# Patient Record
Sex: Male | Born: 1970 | Race: Black or African American | Hispanic: No | Marital: Married | Smoking: Current every day smoker
Health system: Southern US, Community
[De-identification: ages and names within clinical notes are randomized; demographics above are authoritative.]

---

## 2001-03-16 ENCOUNTER — Emergency Department (HOSPITAL_COMMUNITY): Admission: EM | Admit: 2001-03-16 | Discharge: 2001-03-16 | Payer: Self-pay | Admitting: Emergency Medicine

## 2001-04-30 ENCOUNTER — Emergency Department (HOSPITAL_COMMUNITY): Admission: EM | Admit: 2001-04-30 | Discharge: 2001-04-30 | Payer: Self-pay | Admitting: Emergency Medicine

## 2002-09-22 ENCOUNTER — Encounter: Payer: Self-pay | Admitting: *Deleted

## 2002-09-22 ENCOUNTER — Inpatient Hospital Stay (HOSPITAL_COMMUNITY): Admission: EM | Admit: 2002-09-22 | Discharge: 2002-09-23 | Payer: Self-pay | Admitting: *Deleted

## 2003-01-02 ENCOUNTER — Emergency Department (HOSPITAL_COMMUNITY): Admission: EM | Admit: 2003-01-02 | Discharge: 2003-01-02 | Payer: Self-pay | Admitting: Emergency Medicine

## 2003-01-07 ENCOUNTER — Emergency Department (HOSPITAL_COMMUNITY): Admission: EM | Admit: 2003-01-07 | Discharge: 2003-01-07 | Payer: Self-pay | Admitting: Emergency Medicine

## 2003-03-04 ENCOUNTER — Emergency Department (HOSPITAL_COMMUNITY): Admission: EM | Admit: 2003-03-04 | Discharge: 2003-03-04 | Payer: Self-pay

## 2003-09-26 ENCOUNTER — Emergency Department (HOSPITAL_COMMUNITY): Admission: EM | Admit: 2003-09-26 | Discharge: 2003-09-26 | Payer: Self-pay | Admitting: *Deleted

## 2003-10-29 ENCOUNTER — Emergency Department (HOSPITAL_COMMUNITY): Admission: EM | Admit: 2003-10-29 | Discharge: 2003-10-29 | Payer: Self-pay | Admitting: Emergency Medicine

## 2004-01-26 ENCOUNTER — Emergency Department (HOSPITAL_COMMUNITY): Admission: EM | Admit: 2004-01-26 | Discharge: 2004-01-26 | Payer: Self-pay | Admitting: Emergency Medicine

## 2004-03-08 ENCOUNTER — Emergency Department (HOSPITAL_COMMUNITY): Admission: EM | Admit: 2004-03-08 | Discharge: 2004-03-08 | Payer: Self-pay | Admitting: Emergency Medicine

## 2004-03-08 ENCOUNTER — Ambulatory Visit (HOSPITAL_COMMUNITY): Admission: RE | Admit: 2004-03-08 | Discharge: 2004-03-08 | Payer: Self-pay | Admitting: Orthopedic Surgery

## 2004-03-08 ENCOUNTER — Ambulatory Visit (HOSPITAL_BASED_OUTPATIENT_CLINIC_OR_DEPARTMENT_OTHER): Admission: RE | Admit: 2004-03-08 | Discharge: 2004-03-08 | Payer: Self-pay | Admitting: Orthopedic Surgery

## 2004-03-11 ENCOUNTER — Encounter (HOSPITAL_BASED_OUTPATIENT_CLINIC_OR_DEPARTMENT_OTHER): Admission: RE | Admit: 2004-03-11 | Discharge: 2004-05-04 | Payer: Self-pay | Admitting: Orthopedic Surgery

## 2004-05-07 ENCOUNTER — Emergency Department (HOSPITAL_COMMUNITY): Admission: EM | Admit: 2004-05-07 | Discharge: 2004-05-08 | Payer: Self-pay | Admitting: Emergency Medicine

## 2005-10-16 ENCOUNTER — Emergency Department (HOSPITAL_COMMUNITY): Admission: EM | Admit: 2005-10-16 | Discharge: 2005-10-16 | Payer: Self-pay | Admitting: Emergency Medicine

## 2005-11-20 ENCOUNTER — Emergency Department (HOSPITAL_COMMUNITY): Admission: EM | Admit: 2005-11-20 | Discharge: 2005-11-20 | Payer: Self-pay | Admitting: Emergency Medicine

## 2006-06-18 ENCOUNTER — Emergency Department (HOSPITAL_COMMUNITY): Admission: EM | Admit: 2006-06-18 | Discharge: 2006-06-18 | Payer: Self-pay | Admitting: Family Medicine

## 2008-02-10 ENCOUNTER — Emergency Department (HOSPITAL_COMMUNITY): Admission: EM | Admit: 2008-02-10 | Discharge: 2008-02-11 | Payer: Self-pay | Admitting: Emergency Medicine

## 2008-02-21 ENCOUNTER — Ambulatory Visit: Payer: Self-pay | Admitting: Pulmonary Disease

## 2008-02-21 ENCOUNTER — Telehealth (INDEPENDENT_AMBULATORY_CARE_PROVIDER_SITE_OTHER): Payer: Self-pay | Admitting: *Deleted

## 2008-02-21 DIAGNOSIS — R930 Abnormal findings on diagnostic imaging of skull and head, not elsewhere classified: Secondary | ICD-10-CM | POA: Insufficient documentation

## 2008-02-24 ENCOUNTER — Telehealth (INDEPENDENT_AMBULATORY_CARE_PROVIDER_SITE_OTHER): Payer: Self-pay | Admitting: *Deleted

## 2008-02-26 ENCOUNTER — Encounter: Payer: Self-pay | Admitting: Pulmonary Disease

## 2008-02-27 ENCOUNTER — Telehealth: Payer: Self-pay | Admitting: Pulmonary Disease

## 2008-02-28 ENCOUNTER — Ambulatory Visit: Payer: Self-pay | Admitting: Pulmonary Disease

## 2008-02-28 ENCOUNTER — Encounter: Payer: Self-pay | Admitting: Pulmonary Disease

## 2008-02-28 ENCOUNTER — Ambulatory Visit: Admission: RE | Admit: 2008-02-28 | Discharge: 2008-02-28 | Payer: Self-pay | Admitting: Pulmonary Disease

## 2008-03-02 ENCOUNTER — Telehealth: Payer: Self-pay | Admitting: Pulmonary Disease

## 2008-03-13 ENCOUNTER — Encounter: Payer: Self-pay | Admitting: Pulmonary Disease

## 2008-03-16 ENCOUNTER — Telehealth: Payer: Self-pay | Admitting: Pulmonary Disease

## 2008-03-18 ENCOUNTER — Telehealth: Payer: Self-pay | Admitting: Pulmonary Disease

## 2008-05-10 ENCOUNTER — Encounter: Payer: Self-pay | Admitting: Pulmonary Disease

## 2008-05-11 ENCOUNTER — Encounter: Admission: RE | Admit: 2008-05-11 | Discharge: 2008-05-11 | Payer: Self-pay | Admitting: Pulmonary Disease

## 2008-06-01 ENCOUNTER — Ambulatory Visit: Payer: Self-pay | Admitting: Pulmonary Disease

## 2008-06-01 DIAGNOSIS — A150 Tuberculosis of lung: Secondary | ICD-10-CM | POA: Insufficient documentation

## 2008-06-17 ENCOUNTER — Emergency Department (HOSPITAL_COMMUNITY): Admission: EM | Admit: 2008-06-17 | Discharge: 2008-06-17 | Payer: Self-pay | Admitting: Emergency Medicine

## 2008-09-02 ENCOUNTER — Encounter: Payer: Self-pay | Admitting: Pulmonary Disease

## 2008-09-02 ENCOUNTER — Encounter: Admission: RE | Admit: 2008-09-02 | Discharge: 2008-09-02 | Payer: Self-pay | Admitting: Pulmonary Disease

## 2008-09-09 ENCOUNTER — Encounter: Payer: Self-pay | Admitting: Pulmonary Disease

## 2008-11-24 ENCOUNTER — Encounter: Admission: RE | Admit: 2008-11-24 | Discharge: 2008-11-24 | Payer: Self-pay | Admitting: Infectious Diseases

## 2010-03-04 IMAGING — CR DG CHEST 2V
2 series · 2 of 2 positions shown · non-contrast
Comparison: PA and lateral chest [DATE].

CLINICAL DATA: Pneumonia.

CHEST - 2 VIEW

[view not recorded (1 of 2)]
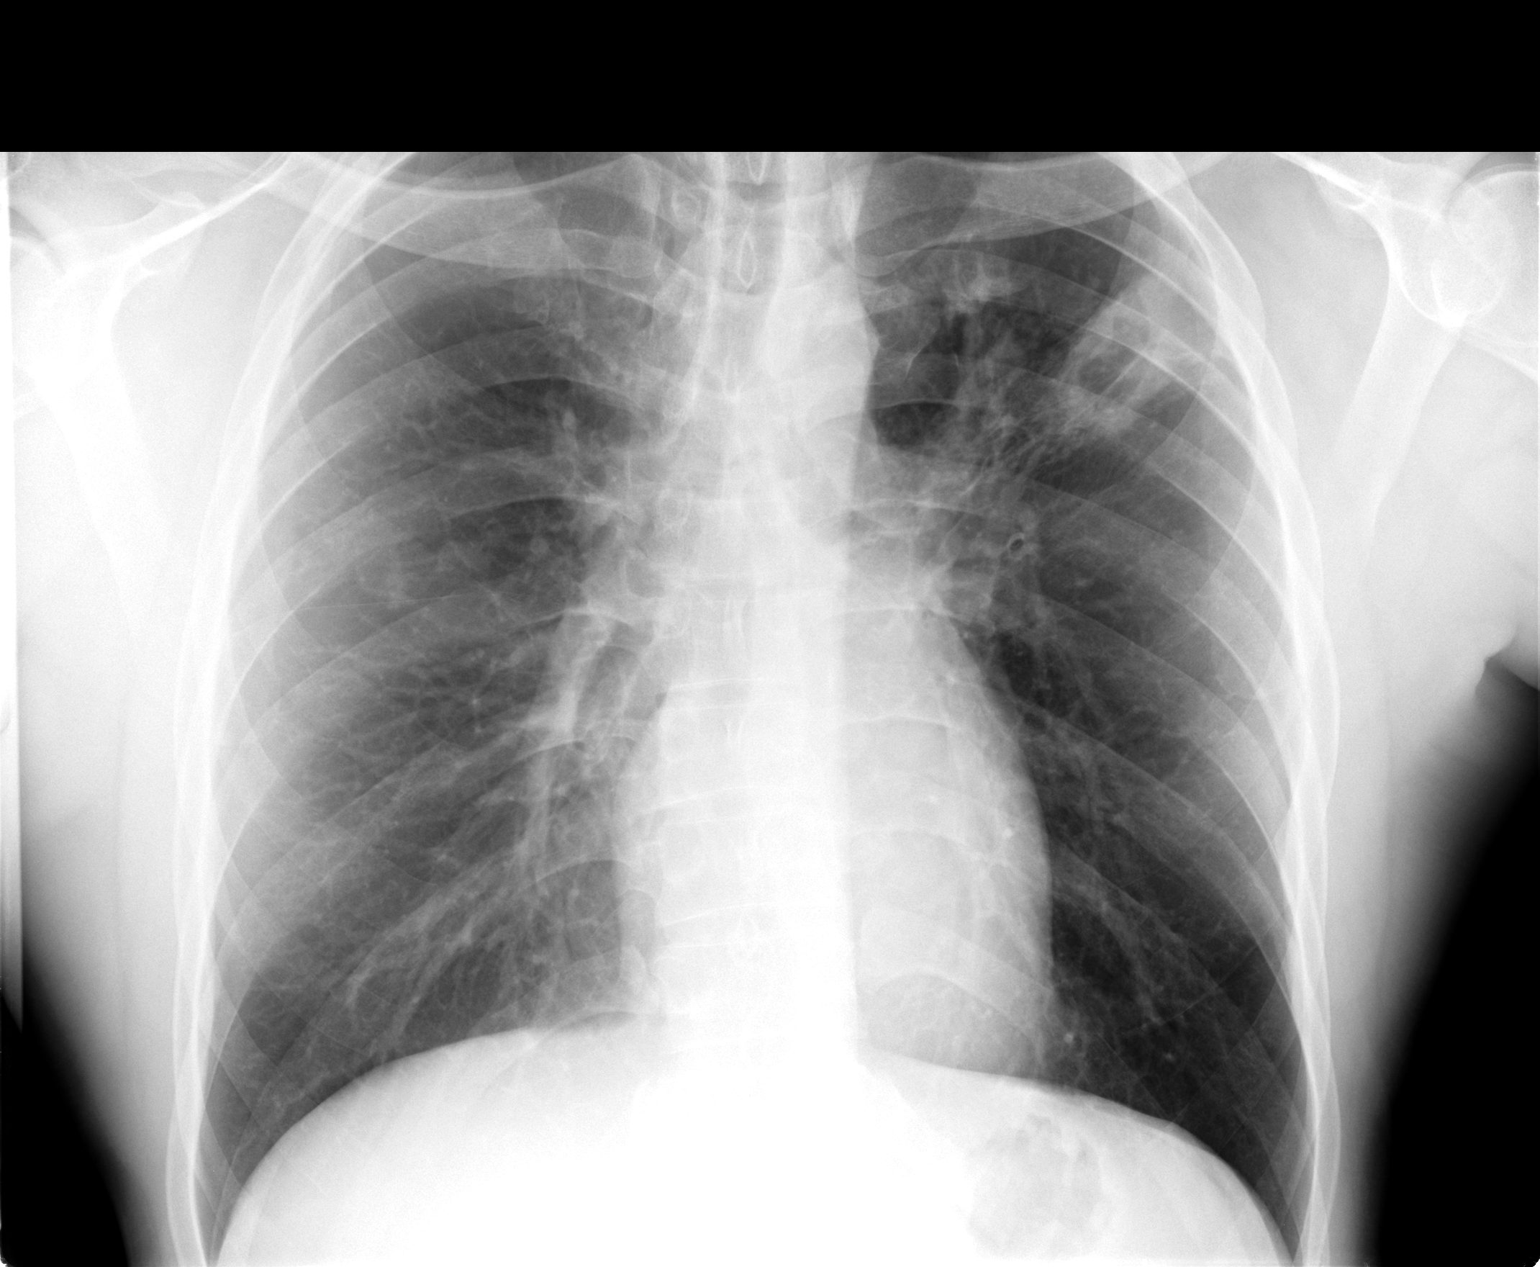

[view not recorded (2 of 2)]
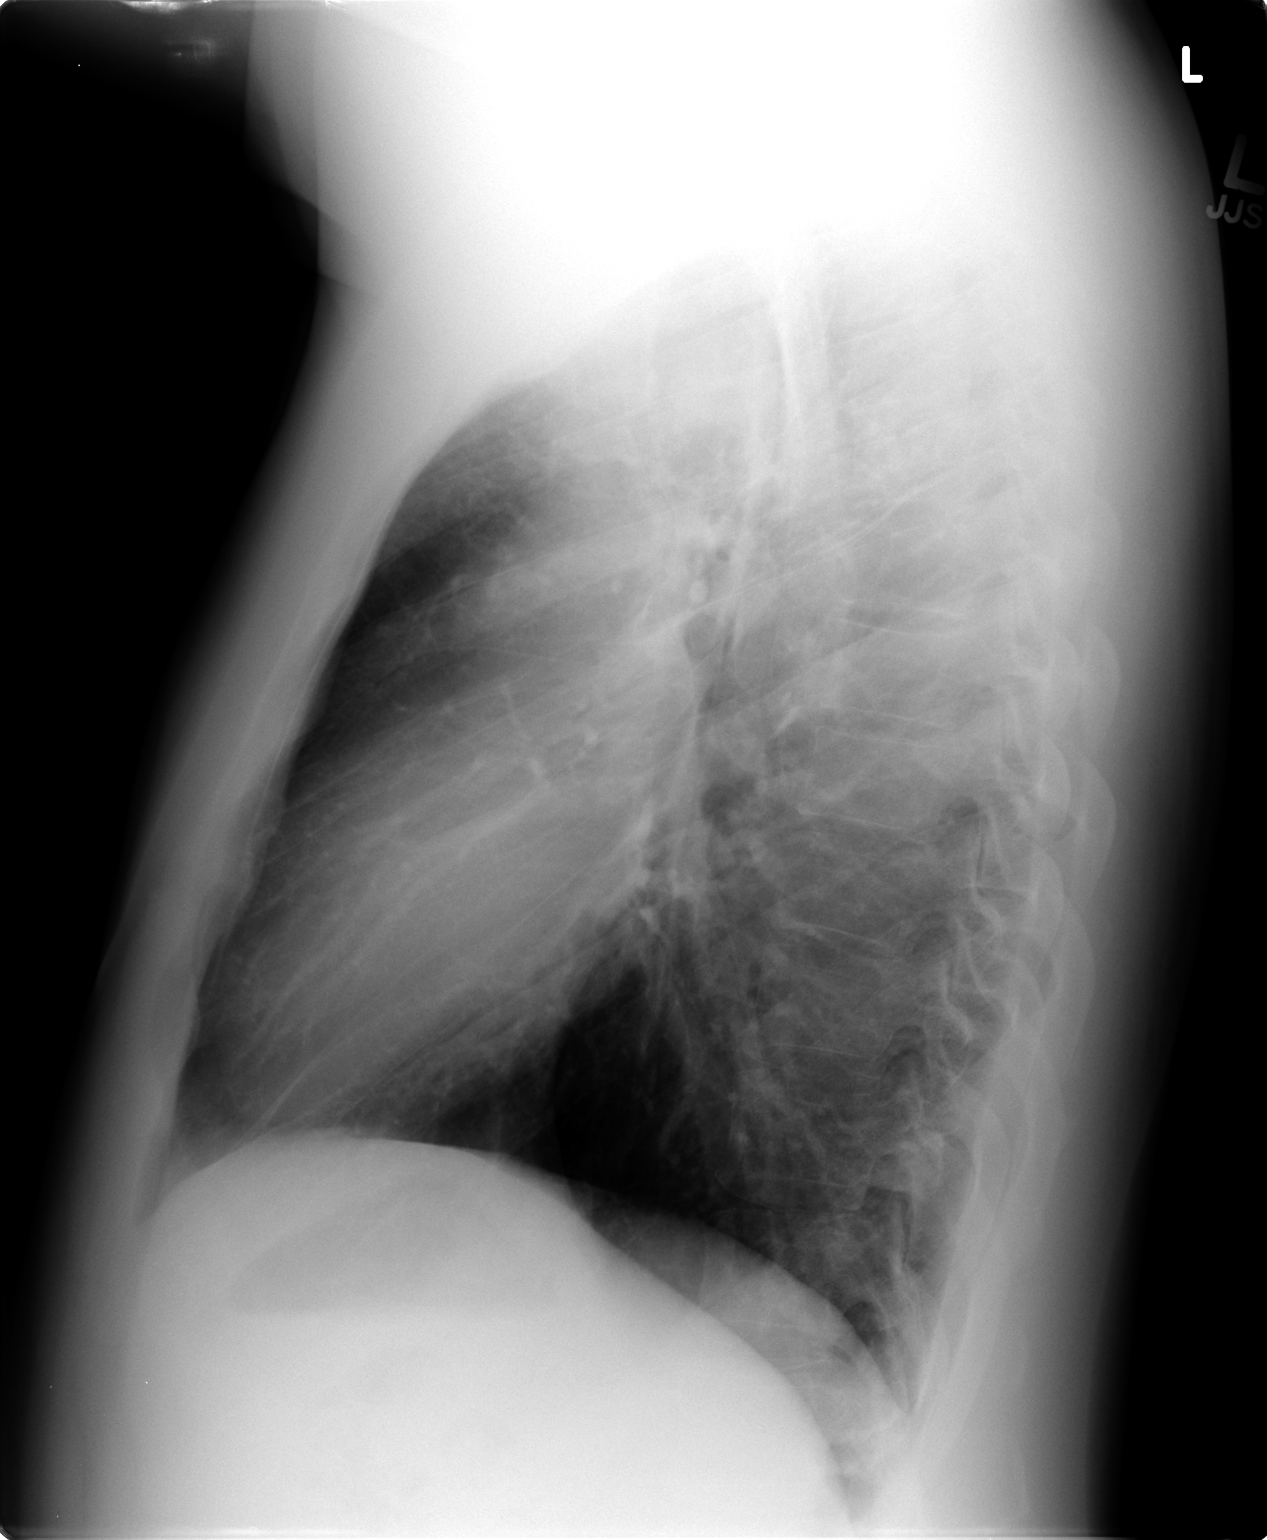

[2 of 2 positions shown; findings below may reference images not displayed]

FINDINGS: Again seen is a cavitary lesion in the left upper lobe.
Right lung is clear.  No effusion.  Heart size normal.
IMPRESSION: Unchanged cavitary lesion in the left upper lobe.  If the lesion is
not markedly improved in 2-3 weeks, chest CT is recommend for
further evaluation.

## 2010-05-23 LAB — CBC
HCT: 40.2 % (ref 39.0–52.0)
Hemoglobin: 13.6 g/dL (ref 13.0–17.0)
MCHC: 33.8 g/dL (ref 30.0–36.0)
MCV: 89 fL (ref 78.0–100.0)
Platelets: 364 10*3/uL (ref 150–400)
RBC: 4.52 MIL/uL (ref 4.22–5.81)
RDW: 13.5 % (ref 11.5–15.5)
WBC: 8.4 10*3/uL (ref 4.0–10.5)

## 2010-05-23 LAB — DIFFERENTIAL
Basophils Absolute: 0.1 10*3/uL (ref 0.0–0.1)
Basophils Relative: 1 % (ref 0–1)
Eosinophils Absolute: 0.2 10*3/uL (ref 0.0–0.7)
Eosinophils Relative: 2 % (ref 0–5)
Lymphocytes Relative: 29 % (ref 12–46)
Lymphs Abs: 2.4 10*3/uL (ref 0.7–4.0)
Monocytes Absolute: 0.6 10*3/uL (ref 0.1–1.0)
Monocytes Relative: 7 % (ref 3–12)
Neutro Abs: 5.1 10*3/uL (ref 1.7–7.7)
Neutrophils Relative %: 61 % (ref 43–77)

## 2010-05-23 LAB — COMPREHENSIVE METABOLIC PANEL
ALT: 21 U/L (ref 0–53)
AST: 25 U/L (ref 0–37)
Albumin: 4 g/dL (ref 3.5–5.2)
Alkaline Phosphatase: 100 U/L (ref 39–117)
BUN: 19 mg/dL (ref 6–23)
CO2: 23 mEq/L (ref 19–32)
Calcium: 9.6 mg/dL (ref 8.4–10.5)
Chloride: 104 mEq/L (ref 96–112)
Creatinine, Ser: 1.09 mg/dL (ref 0.4–1.5)
GFR calc Af Amer: >60 mL/min (ref >60)
GFR calc non Af Amer: >60 mL/min (ref >60)
Glucose, Bld: 115 mg/dL — ABNORMAL HIGH (ref 70–99)
Potassium: 4 mEq/L (ref 3.5–5.1)
Sodium: 136 mEq/L (ref 135–145)
Total Bilirubin: 0.7 mg/dL (ref 0.3–1.2)
Total Protein: 8 g/dL (ref 6.0–8.3)

## 2010-05-23 LAB — CULTURE, RESPIRATORY W GRAM STAIN: Special Requests: ABNORMAL

## 2010-05-23 LAB — AFB CULTURE WITH SMEAR (NOT AT ARMC)
Acid Fast Smear: NONE SEEN
Special Requests: ABNORMAL

## 2010-05-23 LAB — FUNGUS CULTURE W SMEAR
Fungal Smear: NONE SEEN
Special Requests: ABNORMAL

## 2010-05-23 LAB — AFB STAIN: Acid Fast Smear: NONE SEEN

## 2010-05-23 LAB — P CARINII SMEAR DFA: Pneumocystis carinii DFA: NEGATIVE

## 2010-06-21 NOTE — Op Note (Signed)
NAMEMAJESTY, OEHLERT                ACCOUNT NO.:  [id number]   MEDICAL RECORD NO.:  [id number]          PATIENT TYPE:  AMB   LOCATION:  CARD                         FACILITY:  [facility]   PHYSICIAN:  Barbaraann Share, MD,FCCPDATE OF BIRTH:  1968-11-26   DATE OF PROCEDURE:  02/28/2008  DATE OF DISCHARGE:                               OPERATIVE REPORT   PROCEDURE:  Flexible fiberoptic bronchoscopy.   INDICATIONS:  Cavitary left upper lobe lesion of unknown etiology.   ANESTHESIA:  Demerol 100 mg IV in two different aliquots, also Versed 15  mg in multiple aliquots.  Also topical 1% lidocaine to vocal cords and  airways during the procedure.   DESCRIPTION OF PROCEDURE:  After obtaining informed consent and under  close cardiopulmonary monitoring, the above preop anesthesia was given  and the Fiberoptic scope was passed to the right naris and into the  posterior pharynx where there was no lesions or other abnormalities  seen.  The vocal cords appeared to be within normal limits and moved  bilaterally on phonation.  The scope was then passed into the trachea  where it was examined along its entire length down to the level of the  carina which was normal.  The left and right tracheobronchial trees were  examined serially to the subsegmental level with no endobronchial  abnormality being found.  The scope was then passed into the left upper  lobe and bronchoalveolar lavage was done from both the anterior segment  of the left upper lobe and the posterior segment of the left upper lobe  with purulent material being obtained.  Bronchial brushings were also  done from the same segments and sent for AFB smear.  Overall, the  patient tolerated the procedure well and there were no complications.      Barbaraann Share, MD,FCCP  Electronically Signed     [facility]/MEDQ  D:  02/28/2008  T:  02/28/2008  Job:  [id number]

## 2010-06-24 NOTE — Op Note (Signed)
NAMEERIC, Eduardo Velazquez                ACCOUNT NO.:  [id number]   MEDICAL RECORD NO.:  [id number]          PATIENT TYPE:  AMB   LOCATION:  DSC                          FACILITY:  [facility]   PHYSICIAN:  Cindee Salt, M.D.       DATE OF BIRTH:  1971-01-10   DATE OF PROCEDURE:  03/08/2004  DATE OF DISCHARGE:                                 OPERATIVE REPORT   PREOPERATIVE DIAGNOSIS:  Infection right middle finger.   POSTOPERATIVE DIAGNOSIS:  Infection right middle finger..   OPERATION:  Incision and drainage abscess, right middle finger.   SURGEON:  Cindee Salt, M.D.   ASSISTANTCarolyne Fiscal.   ANESTHESIA:  IV regional.   HISTORY:  The patient is a 40 year old male with a one-day history of  swelling of his right middle finger proximal phalanx. He does not have any  __________ signs.  He is admitted for incision and drainage of  an abscess  on the proximal phalanx right middle finger.   PROCEDURE:  The patient is brought to the operating room and IV regional  anesthetic carried out without difficulty.  He was prepped using DuraPrep,  supine position, right arm free and mid lateral incision was made directly  over the pointing area of the swollen portion of his proximal phalanx.  Pus  was immediately encountered. Cultures were taken for both aerobic and  anaerobic cultures. The entire area was opened. The flexor sheath was not  involved.  The area was then copiously irrigated with saline, packed with an  iodoform gauze. Sterile compressive dressing and splint was applied. The  patient tolerated the procedure well was taken to the recovery for  observation in satisfactory condition. He is discharged home return on  Thursday on Vicodin and Keflex.      [facility]/MEDQ  D:  03/08/2004  T:  03/08/2004  Job:  [id number]

## 2010-07-18 ENCOUNTER — Ambulatory Visit (INDEPENDENT_AMBULATORY_CARE_PROVIDER_SITE_OTHER): Payer: BC Managed Care – PPO

## 2010-07-18 ENCOUNTER — Inpatient Hospital Stay (INDEPENDENT_AMBULATORY_CARE_PROVIDER_SITE_OTHER)
Admission: RE | Admit: 2010-07-18 | Discharge: 2010-07-18 | Disposition: A | Discharge status: Home / Self Care | Payer: BC Managed Care – PPO | Source: Ambulatory Visit | Attending: Emergency Medicine | Admitting: Emergency Medicine

## 2010-07-18 DIAGNOSIS — M549 Dorsalgia, unspecified: Secondary | ICD-10-CM

## 2010-07-18 LAB — POCT URINALYSIS DIP (DEVICE)
Bilirubin Urine: NEGATIVE
Glucose, UA: NEGATIVE mg/dL
Ketones, ur: NEGATIVE mg/dL
Leukocytes, UA: NEGATIVE
Nitrite: NEGATIVE
Protein, ur: NEGATIVE mg/dL
Specific Gravity, Urine: 1.025 (ref 1.005–1.030)
Urobilinogen, UA: 1 mg/dL (ref 0.0–1.0)
pH: 6 (ref 5.0–8.0)

## 2010-11-10 ENCOUNTER — Emergency Department (HOSPITAL_COMMUNITY)
Admission: EM | Admit: 2010-11-10 | Discharge: 2010-11-10 | Disposition: A | Discharge status: Home / Self Care | Payer: BC Managed Care – PPO | Attending: Emergency Medicine | Admitting: Emergency Medicine

## 2010-11-10 ENCOUNTER — Emergency Department (HOSPITAL_COMMUNITY): Payer: BC Managed Care – PPO

## 2010-11-10 DIAGNOSIS — R0609 Other forms of dyspnea: Secondary | ICD-10-CM | POA: Insufficient documentation

## 2010-11-10 DIAGNOSIS — J40 Bronchitis, not specified as acute or chronic: Secondary | ICD-10-CM | POA: Insufficient documentation

## 2010-11-10 DIAGNOSIS — R0989 Other specified symptoms and signs involving the circulatory and respiratory systems: Secondary | ICD-10-CM | POA: Insufficient documentation

## 2010-11-10 DIAGNOSIS — R05 Cough: Secondary | ICD-10-CM | POA: Insufficient documentation

## 2010-11-10 DIAGNOSIS — R0602 Shortness of breath: Secondary | ICD-10-CM | POA: Insufficient documentation

## 2010-11-10 DIAGNOSIS — R059 Cough, unspecified: Secondary | ICD-10-CM | POA: Insufficient documentation

## 2011-02-19 ENCOUNTER — Encounter (HOSPITAL_COMMUNITY): Payer: Self-pay | Admitting: *Deleted

## 2011-02-19 ENCOUNTER — Emergency Department (HOSPITAL_COMMUNITY)
Admission: EM | Admit: 2011-02-19 | Discharge: 2011-02-19 | Disposition: A | Discharge status: Home / Self Care | Payer: BC Managed Care – PPO | Attending: Emergency Medicine | Admitting: Emergency Medicine

## 2011-02-19 DIAGNOSIS — K089 Disorder of teeth and supporting structures, unspecified: Secondary | ICD-10-CM | POA: Insufficient documentation

## 2011-02-19 DIAGNOSIS — R220 Localized swelling, mass and lump, head: Secondary | ICD-10-CM | POA: Insufficient documentation

## 2011-02-19 DIAGNOSIS — K029 Dental caries, unspecified: Secondary | ICD-10-CM | POA: Insufficient documentation

## 2011-02-19 DIAGNOSIS — K047 Periapical abscess without sinus: Secondary | ICD-10-CM

## 2011-02-19 DIAGNOSIS — R221 Localized swelling, mass and lump, neck: Secondary | ICD-10-CM | POA: Insufficient documentation

## 2011-02-19 MED ORDER — HYDROCODONE-ACETAMINOPHEN 5-325 MG PO TABS
2.0000 | ORAL_TABLET | Freq: Once | ORAL | Status: AC
  Administered 2011-02-19: 2 via ORAL
  Filled 2011-02-19: qty 2

## 2011-02-19 MED ORDER — IBUPROFEN 800 MG PO TABS: 800.0000 mg | ORAL_TABLET | Freq: Three times a day (TID) | ORAL | Status: AC

## 2011-02-19 MED ORDER — HYDROCODONE-ACETAMINOPHEN 5-325 MG PO TABS: 1.0000 | ORAL_TABLET | Freq: Four times a day (QID) | ORAL | Status: DC | PRN

## 2011-02-19 MED ORDER — HYDROCODONE-ACETAMINOPHEN 5-325 MG PO TABS: 1.0000 | ORAL_TABLET | ORAL | Status: AC | PRN

## 2011-02-19 MED ORDER — PENICILLIN V POTASSIUM 500 MG PO TABS: 500.0000 mg | ORAL_TABLET | Freq: Four times a day (QID) | ORAL | Status: AC

## 2011-02-19 NOTE — ED Notes (Signed)
Toothache since yesterday 

## 2011-02-19 NOTE — ED Notes (Signed)
Patient states toothache that started last night rates it 10/10 does not have a local dentist.

## 2011-02-19 NOTE — ED Provider Notes (Signed)
History     CSN: [id number]  Arrival date & time 02/19/11  4098   First MD Initiated Contact with Patient 02/19/11 2115      Chief Complaint  Patient presents with  . Dental Pain    (Consider location/radiation/quality/duration/timing/severity/associated sxs/prior treatment) HPI Comments: Patient reports left upper lower dental pain that began yesterday.  Pain is throbbing, exacerbated by pressure and attempting to eat.  Has taken goody powders without improvement. Denies fevers, sore throat, difficulty breathing or swallowing.   Patient is a 41 y.o. male presenting with tooth pain. The history is provided by the patient.  Dental Pain   History reviewed. No pertinent past medical history.  History reviewed. No pertinent past surgical history.  No family history on file.  History  Substance Use Topics  . Smoking status: Never Smoker   . Smokeless tobacco: Not on file  . Alcohol Use: No      Review of Systems  All other systems reviewed and are negative.    Allergies  Review of patient's allergies indicates no known allergies.  Home Medications   Current Outpatient Rx  Name Route Sig Dispense Refill  . GOODY HEADACHE PO Oral Take 1 packet by mouth 2 (two) times daily as needed. For pain    . HYDROCODONE-ACETAMINOPHEN 5-325 MG PO TABS Oral Take 1 tablet by mouth every 4 (four) hours as needed for pain. 20 tablet 0  . IBUPROFEN 800 MG PO TABS Oral Take 1 tablet (800 mg total) by mouth 3 (three) times daily. 21 tablet 0  . PENICILLIN V POTASSIUM 500 MG PO TABS Oral Take 1 tablet (500 mg total) by mouth 4 (four) times daily. 40 tablet 0    BP 133/76  Pulse 88  Temp(Src) 99.3 F (37.4 C) (Oral)  Resp 20  SpO2 98%  Physical Exam  Nursing note and vitals reviewed. Constitutional: He is oriented to person, place, and time. He appears well-developed and well-nourished.  HENT:  Head: Normocephalic and atraumatic.  Mouth/Throat: Oropharynx is clear and moist. No  oropharyngeal exudate, posterior oropharyngeal edema, posterior oropharyngeal erythema or tonsillar abscesses.    Neck: Neck supple.  Cardiovascular: Normal rate and regular rhythm.   Pulmonary/Chest: Effort normal and breath sounds normal. No stridor.  Neurological: He is alert and oriented to person, place, and time.    ED Course  Procedures (including critical care time)  Labs Reviewed - No data to display No results found.   1. Dental abscess   2. Dental decay       MDM  Patient with severe decay and likely dental abscess of left lower first molar.  Pt is afebrile, pharynx is without edema.     Medical screening examination/treatment/procedure(s) were performed by non-physician practitioner and as supervising physician I was immediately available for consultation/collaboration. Osvaldo Human, M.D.      Dillard Cannon [place], [place] 02/19/11 2216  Carleene Cooper III, MD 02/21/11 [phone]

## 2011-05-13 ENCOUNTER — Encounter (HOSPITAL_COMMUNITY): Payer: Self-pay | Admitting: *Deleted

## 2011-05-13 ENCOUNTER — Emergency Department (HOSPITAL_COMMUNITY): Payer: 59

## 2011-05-13 ENCOUNTER — Emergency Department (HOSPITAL_COMMUNITY)
Admission: EM | Admit: 2011-05-13 | Discharge: 2011-05-13 | Disposition: A | Discharge status: Home / Self Care | Payer: 59 | Attending: Emergency Medicine | Admitting: Emergency Medicine

## 2011-05-13 DIAGNOSIS — J029 Acute pharyngitis, unspecified: Secondary | ICD-10-CM | POA: Insufficient documentation

## 2011-05-13 DIAGNOSIS — R059 Cough, unspecified: Secondary | ICD-10-CM | POA: Insufficient documentation

## 2011-05-13 DIAGNOSIS — B349 Viral infection, unspecified: Secondary | ICD-10-CM

## 2011-05-13 DIAGNOSIS — R05 Cough: Secondary | ICD-10-CM | POA: Insufficient documentation

## 2011-05-13 DIAGNOSIS — J3489 Other specified disorders of nose and nasal sinuses: Secondary | ICD-10-CM | POA: Insufficient documentation

## 2011-05-13 MED ORDER — AZITHROMYCIN 250 MG PO TABS: ORAL_TABLET | ORAL | Status: DC

## 2011-05-13 MED ORDER — HYDROCODONE-ACETAMINOPHEN 7.5-325 MG/15ML PO SOLN: 15.0000 mL | Freq: Four times a day (QID) | ORAL | Status: AC | PRN

## 2011-05-13 NOTE — ED Provider Notes (Signed)
History     CSN: [id number]  Arrival date & time 05/13/11  0218   First MD Initiated Contact with Patient 05/13/11 [phone]      Chief Complaint  Patient presents with  . Cough    (Consider location/radiation/quality/duration/timing/severity/associated sxs/prior treatment) HPI Is a 41 year old black male with a two-day history of cough he the cough is moderate to severe. It has not been relieved by over-the-counter medications. This is been accompanied by sore throat, chest soreness from coughing, nasal congestion or rhinorrhea. He denies ear pain. He denies dyspnea. He does have general malaise. He did have one episode of posttussive near emesis. He denies diarrhea.  History reviewed. No pertinent past medical history.  History reviewed. No pertinent past surgical history.  History reviewed. No pertinent family history.  History  Substance Use Topics  . Smoking status: Never Smoker   . Smokeless tobacco: Not on file  . Alcohol Use: No      Review of Systems  All other systems reviewed and are negative.    Allergies  Review of patient's allergies indicates no known allergies.  Home Medications   Current Outpatient Rx  Name Route Sig Dispense Refill  . GOODY HEADACHE PO Oral Take 1 packet by mouth 2 (two) times daily as needed. For pain    . PSEUDOEPH-CPM-DM-APAP 30-2-15-325 MG PO TABS Oral Take 1-2 tablets by mouth every 6 (six) hours as needed. Cough and cold symptoms      BP 123/84  Pulse 78  Temp(Src) 98 F (36.7 C) (Oral)  Resp 17  SpO2 96%  Physical Exam General: Well-developed, well-nourished male in no acute distress; appearance consistent with age of record HENT: normocephalic, atraumatic; nasal congestion; no pharyngeal erythema or exudate Eyes: pupils equal round and reactive to light; extraocular muscles intact Neck: supple Heart: regular rate and rhythm Lungs: clear to auscultation bilaterally; dry cough Abdomen: soft; nondistended;  nontender Extremities: No deformity; full range of motion Neurologic: Awake, alert and oriented; motor function intact in all extremities and symmetric; no facial droop Skin: Warm and dry     ED Course  Procedures (including critical care time)     MDM   Nursing notes and vitals signs, including pulse oximetry, reviewed.  Summary of this visit's results, reviewed by myself:  Imaging Studies: Dg Chest 2 View  05/13/2011  *RADIOLOGY REPORT*  Clinical Data: Cough, congestion, sore throat  CHEST - 2 VIEW  Comparison: 11/10/2010  Findings: Mild scarring in the left upper lobe.  Lungs otherwise essentially clear.  No pleural effusion or pneumothorax.  Cardiomediastinal silhouette is within normal limits.  Visualized osseous structures are within normal limits.  IMPRESSION: No evidence of acute cardiopulmonary disease.  Original Report Authenticated By: Charline Bills, M.D.            Hanley Seamen, MD 05/13/11 [phone]

## 2011-05-13 NOTE — ED Notes (Signed)
Patient with cough, sore throat, congestion, cough getting progressively worse in the last few days.

## 2011-05-13 NOTE — ED Notes (Signed)
Pt reports cough x 2 days.  Sore throat, sore chest from coughing.  Reports being treated for bronchitis previously and thinks that it is back. No distress noted.

## 2012-02-26 ENCOUNTER — Emergency Department (HOSPITAL_COMMUNITY): Payer: Self-pay

## 2012-02-26 ENCOUNTER — Encounter (HOSPITAL_COMMUNITY): Payer: Self-pay | Admitting: Emergency Medicine

## 2012-02-26 ENCOUNTER — Emergency Department (HOSPITAL_COMMUNITY)
Admission: EM | Admit: 2012-02-26 | Discharge: 2012-02-26 | Disposition: A | Discharge status: Home / Self Care | Payer: Self-pay | Attending: Emergency Medicine | Admitting: Emergency Medicine

## 2012-02-26 DIAGNOSIS — IMO0001 Reserved for inherently not codable concepts without codable children: Secondary | ICD-10-CM | POA: Insufficient documentation

## 2012-02-26 DIAGNOSIS — R059 Cough, unspecified: Secondary | ICD-10-CM | POA: Insufficient documentation

## 2012-02-26 DIAGNOSIS — R509 Fever, unspecified: Secondary | ICD-10-CM | POA: Insufficient documentation

## 2012-02-26 DIAGNOSIS — J111 Influenza due to unidentified influenza virus with other respiratory manifestations: Secondary | ICD-10-CM | POA: Insufficient documentation

## 2012-02-26 DIAGNOSIS — F17200 Nicotine dependence, unspecified, uncomplicated: Secondary | ICD-10-CM | POA: Insufficient documentation

## 2012-02-26 DIAGNOSIS — Z79899 Other long term (current) drug therapy: Secondary | ICD-10-CM | POA: Insufficient documentation

## 2012-02-26 DIAGNOSIS — R5381 Other malaise: Secondary | ICD-10-CM | POA: Insufficient documentation

## 2012-02-26 DIAGNOSIS — R05 Cough: Secondary | ICD-10-CM | POA: Insufficient documentation

## 2012-02-26 DIAGNOSIS — R110 Nausea: Secondary | ICD-10-CM | POA: Insufficient documentation

## 2012-02-26 LAB — RAPID STREP SCREEN (MED CTR MEBANE ONLY): Streptococcus, Group A Screen (Direct): NEGATIVE

## 2012-02-26 MED ORDER — ONDANSETRON 8 MG PO TBDP: ORAL_TABLET | ORAL | Status: AC

## 2012-02-26 MED ORDER — GI COCKTAIL ~~LOC~~
30.0000 mL | Freq: Once | ORAL | Status: AC
  Administered 2012-02-26: 30 mL via ORAL
  Filled 2012-02-26: qty 30

## 2012-02-26 MED ORDER — SODIUM CHLORIDE 0.9 % IV BOLUS (SEPSIS)
1000.0000 mL | Freq: Once | INTRAVENOUS | Status: AC
  Administered 2012-02-26: 1000 mL via INTRAVENOUS

## 2012-02-26 MED ORDER — IBUPROFEN 400 MG PO TABS
800.0000 mg | ORAL_TABLET | Freq: Once | ORAL | Status: AC
  Administered 2012-02-26: 800 mg via ORAL
  Filled 2012-02-26: qty 2

## 2012-02-26 MED ORDER — ACETAMINOPHEN 325 MG PO TABS
650.0000 mg | ORAL_TABLET | Freq: Once | ORAL | Status: DC
  Filled 2012-02-26: qty 2

## 2012-02-26 MED ORDER — ACETAMINOPHEN 325 MG PO TABS
650.0000 mg | ORAL_TABLET | Freq: Once | ORAL | Status: AC
  Administered 2012-02-26: 650 mg via ORAL
  Filled 2012-02-26: qty 2

## 2012-02-26 MED ORDER — HYDROCOD POLST-CHLORPHEN POLST 10-8 MG/5ML PO LQCR: 5.0000 mL | Freq: Two times a day (BID) | ORAL | Status: AC

## 2012-02-26 MED ORDER — OSELTAMIVIR PHOSPHATE 75 MG PO CAPS: 75.0000 mg | ORAL_CAPSULE | Freq: Two times a day (BID) | ORAL | Status: AC

## 2012-02-26 NOTE — ED Notes (Signed)
Patient transported to X-ray 

## 2012-02-26 NOTE — ED Notes (Signed)
C/o sore throat, generalized body aches, fever, and cough since Friday.  States cough has been non-productive except for 2 times that he coughed up frothy brown sputum.

## 2012-02-26 NOTE — ED Provider Notes (Signed)
Medical screening examination/treatment/procedure(s) were performed by non-physician practitioner and as supervising physician I was immediately available for consultation/collaboration.  Olivia Mackie, MD 02/26/12 [phone]

## 2012-02-26 NOTE — ED Provider Notes (Signed)
History   Scribed for Olivia Mackie, MD, the patient was seen in room TR08C/TR08C . This chart was scribed by Lewanda Rife.   CSN: [id number]  Arrival date & time 02/26/12  0008   First MD Initiated Contact with Patient 02/26/12 0051      Chief Complaint  Patient presents with  . Sore Throat  . Cough  . Fever  . Generalized Body Aches    HPI Eduardo Velazquez is a 42 y.o. male who presents to the Emergency Department complaining of multiple symptoms of fever, sweats, cough, general body aches and sore throat. Symptoms began gradually and have been persistent. Cough is occasionally productive. Symptoms have also been associated with some episodes of vomiting. This is caused continued sore throat and some upper substernal discomforts and burning. Patient did use some doses of over-the-counter cough and cold medicine without any significant improvement. He has also been using cough drops without much help with symptoms. Patient was also given a dose of Tylenol once arriving to the emergency room and currently reports some improvement of body aches. Patient has been drinking fluids. He reports general decreased diet. He denies any known sick contacts. Denies any recent travel. He denies any other aggravating or alleviating factors. Denies any other associated symptoms.    History reviewed. No pertinent past medical history.  History reviewed. No pertinent past surgical history.  No family history on file.  History  Substance Use Topics  . Smoking status: Current Every Day Smoker  . Smokeless tobacco: Not on file  . Alcohol Use: Yes      Review of Systems  Constitutional: Positive for fever and appetite change (decreased).  HENT: Positive for sore throat.   Respiratory: Positive for cough.   Cardiovascular: Negative.   Gastrointestinal: Positive for vomiting. Negative for abdominal pain and diarrhea.  Genitourinary: Negative for dysuria.  Musculoskeletal: Positive for myalgias.   Skin: Negative.  Negative for rash.  Neurological: Negative.   Hematological: Negative.   Psychiatric/Behavioral: Negative.   All other systems reviewed and are negative.    Allergies  Review of patient's allergies indicates no known allergies.  Home Medications   Current Outpatient Rx  Name  Route  Sig  Dispense  Refill  . GOODY HEADACHE PO   Oral   Take 1 packet by mouth 2 (two) times daily as needed. For pain         . AZITHROMYCIN 250 MG PO TABS      Take 2 tablets today then one daily for the next 4 days.   6 tablet   0     BP 140/81  Pulse 140  Temp 101.7 F (38.7 C) (Oral)  Resp 20  SpO2 93%  Physical Exam  Nursing note and vitals reviewed. Constitutional: He appears well-developed and well-nourished. No distress.  HENT:  Head: Normocephalic and atraumatic.  Right Ear: External ear normal.  Left Ear: External ear normal.  Mouth/Throat: Uvula is midline, oropharynx is clear and moist and mucous membranes are normal. No oropharyngeal exudate, posterior oropharyngeal edema or posterior oropharyngeal erythema.  Eyes: Conjunctivae normal are normal. Right eye exhibits no discharge. Left eye exhibits no discharge. No scleral icterus.  Neck: Normal range of motion and full passive range of motion without pain. Neck supple. No rigidity. No tracheal deviation present.       No meningismus noted on exam.  Cardiovascular: Regular rhythm.  Tachycardia present.   Pulmonary/Chest: Effort normal and breath sounds normal. No stridor. No respiratory distress.  He has no rales. He exhibits no tenderness.  Abdominal: Soft.  Musculoskeletal: He exhibits no edema.  Neurological: He is alert. Cranial nerve deficit: no gross deficits.  Skin: Skin is warm and dry. No rash noted.  Psychiatric: He has a normal mood and affect.    ED Course  Procedures   Results for orders placed during the hospital encounter of 02/26/12  RAPID STREP SCREEN      Component Value Range    Streptococcus, Group A Screen (Direct) NEGATIVE  NEGATIVE      Dg Chest 2 View  02/26/2012  *RADIOLOGY REPORT*  Clinical Data: Cough and pain.  CHEST - 2 VIEW  Comparison: 11/24/2008 and 05/13/2011  Findings: Two views of the chest demonstrate a stable linear density in the left upper chest.  These findings are most compatible with an area scarring.  Otherwise, the lungs are clear. Stable appearance of the heart and mediastinum.  IMPRESSION: No acute chest findings.   Original Report Authenticated By: Richarda Overlie, M.D.      1. Influenza       MDM  Patient seen and evaluated. Patient with diaphoresis and symptoms consistent with influenza.  Patient with good improvement of temperature after treatment with Tylenol and ibuprofen. Patient also given IV fluids and looks and feels much better. Heart rate has improved on monitor. At this time patient stable for discharge home.   I personally performed the services described in this documentation, which was scribed in my presence. The recorded information has been reviewed and is accurate.    Angus Seller, [place] 02/26/12 [phone]

## 2012-07-29 IMAGING — CR DG CHEST 2V
2 series · 2 of 2 positions shown · non-contrast
Comparison: 11/24/2008

CLINICAL DATA: Chest pain

CHEST - 2 VIEW

[view not recorded (1 of 2)]
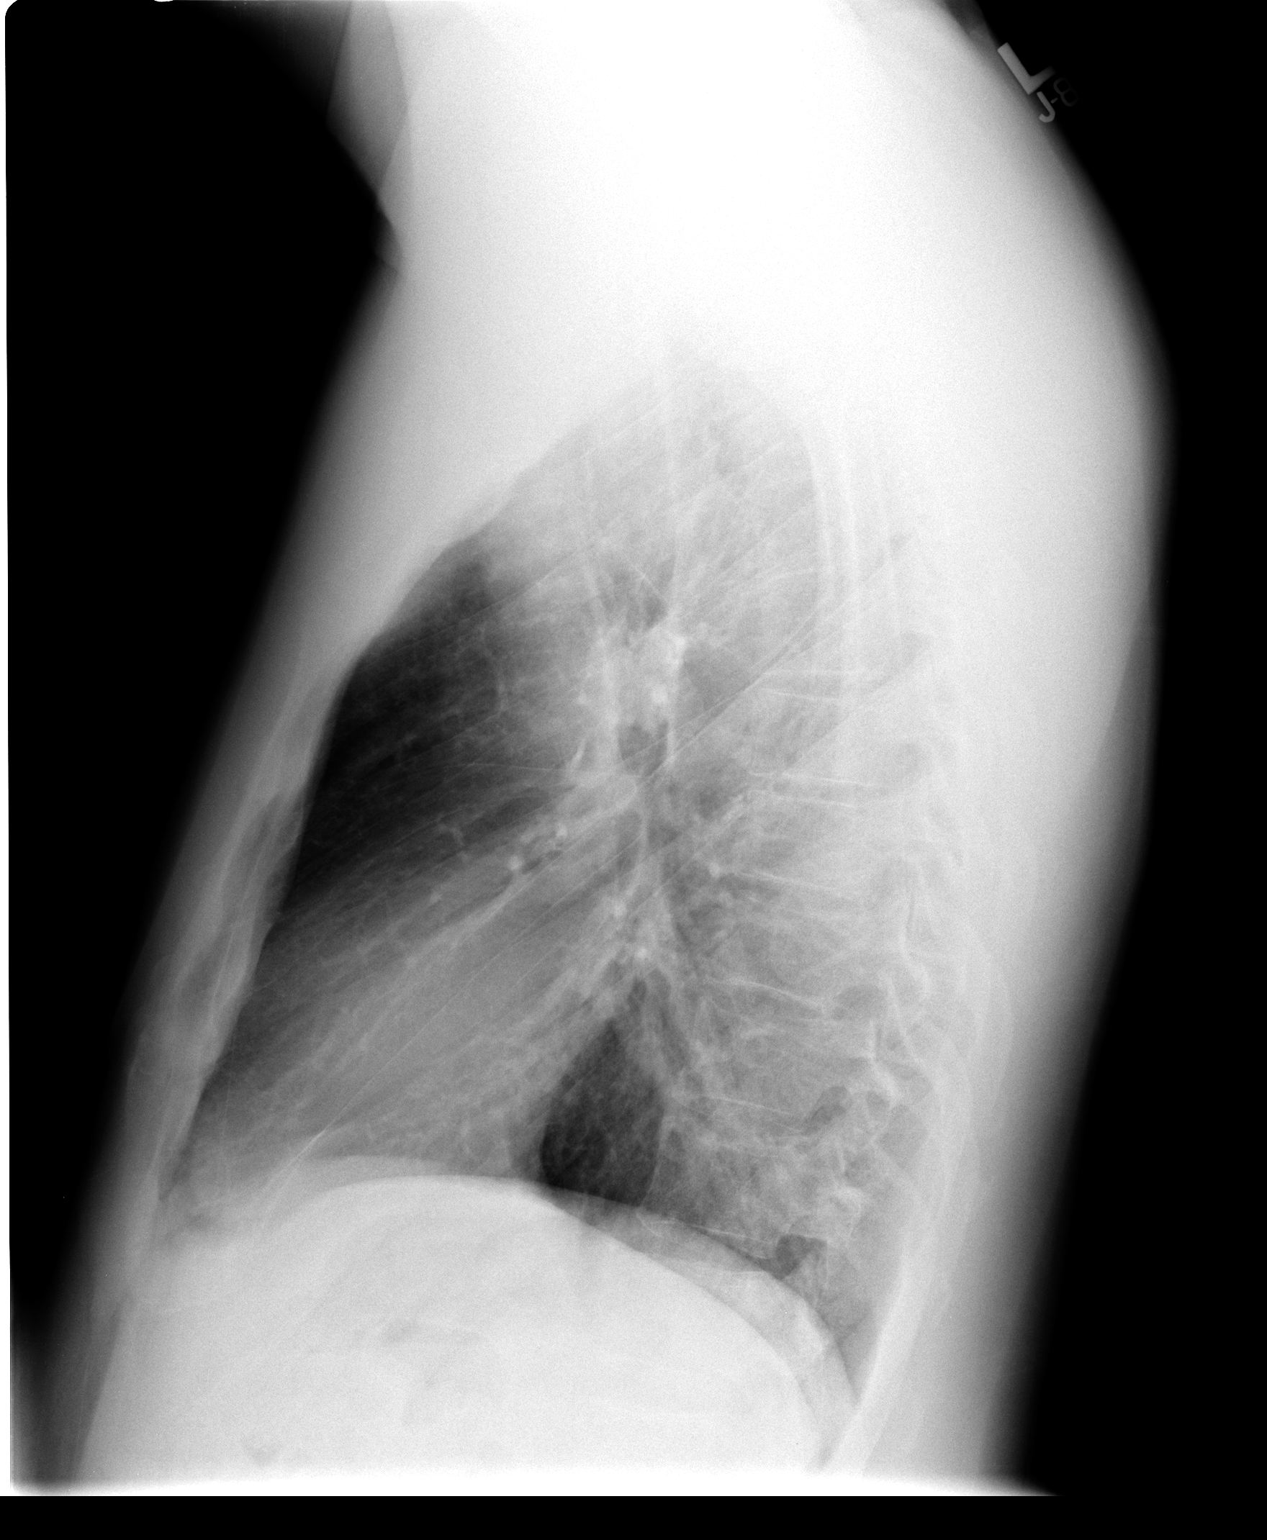

[view not recorded (2 of 2)]
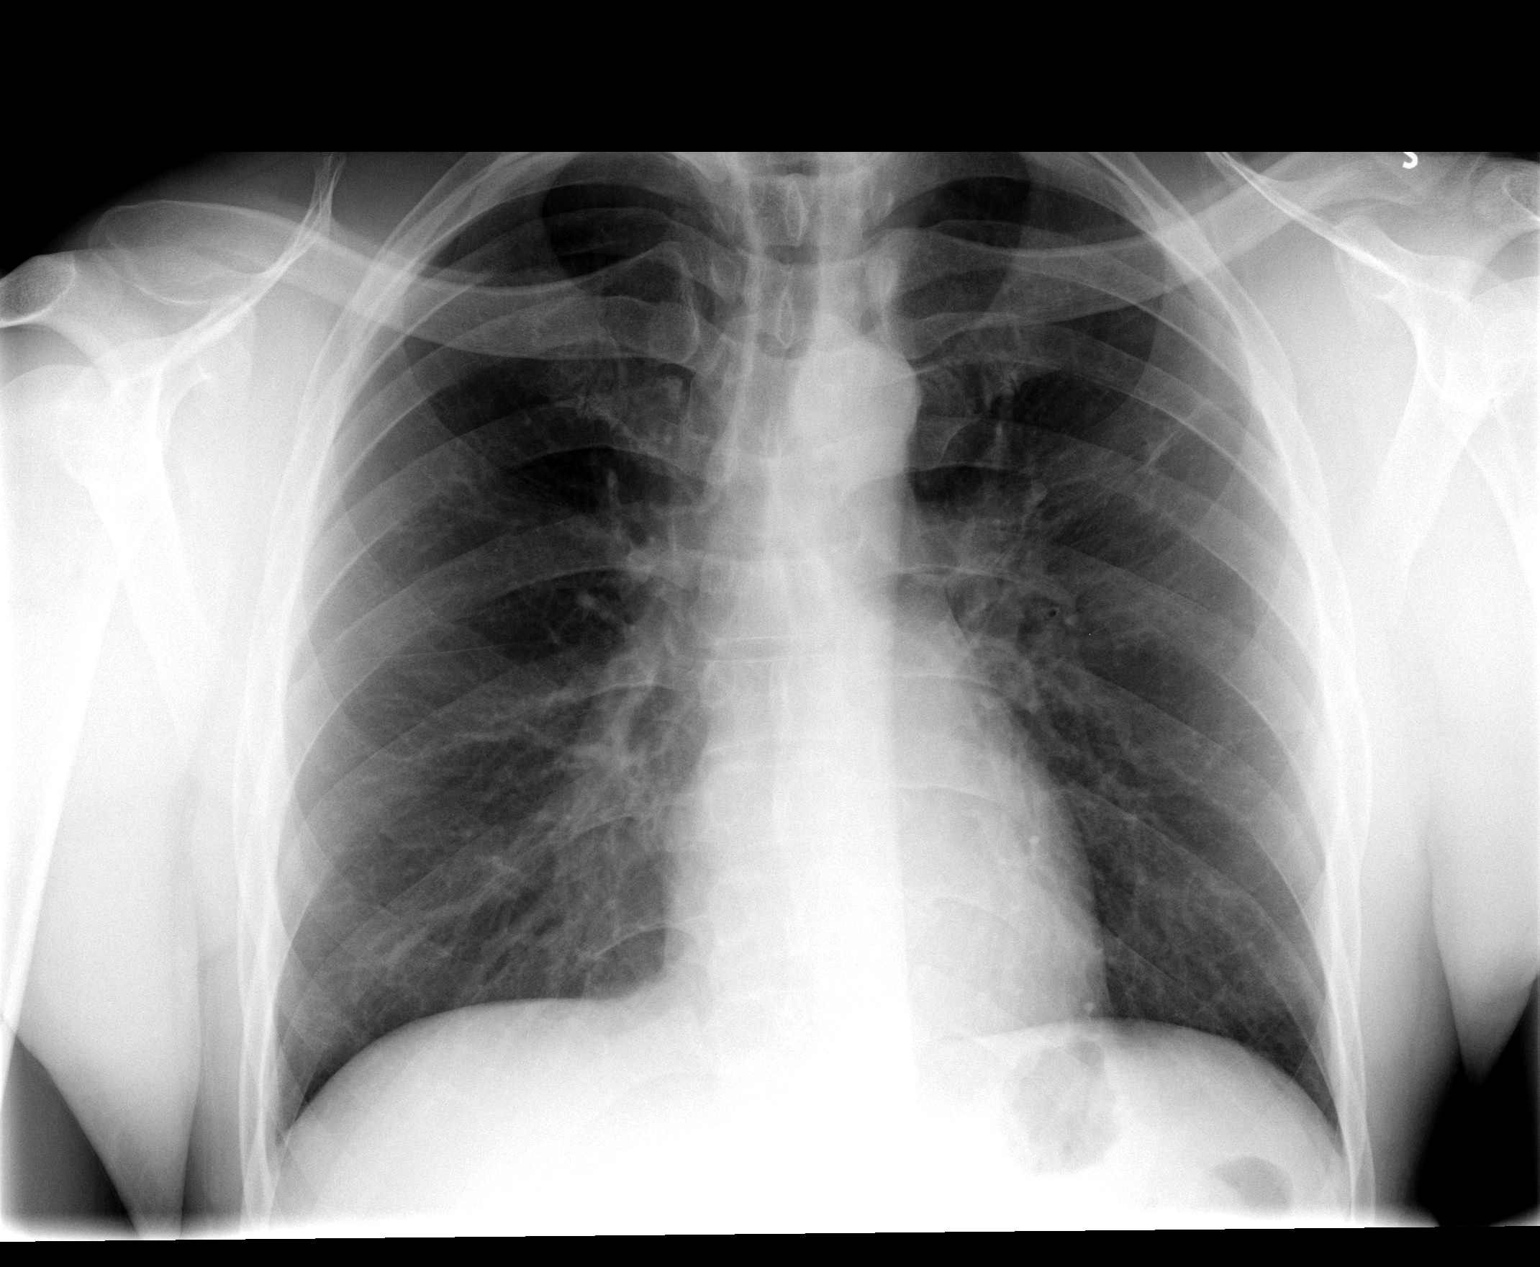

[2 of 2 positions shown; findings below may reference images not displayed]

FINDINGS: Mild linear scarring left upper lobe is stable.  No acute
infiltrate or effusion.  Negative for mass or edema.
IMPRESSION: Mild left upper lobe scarring.  No acute abnormality.

## 2012-07-29 IMAGING — CR DG ABDOMEN 1V
1 series · 1 of 1 positions shown · non-contrast
Comparison: None.

CLINICAL DATA: Right back pain

ABDOMEN - 1 VIEW

[view not recorded]
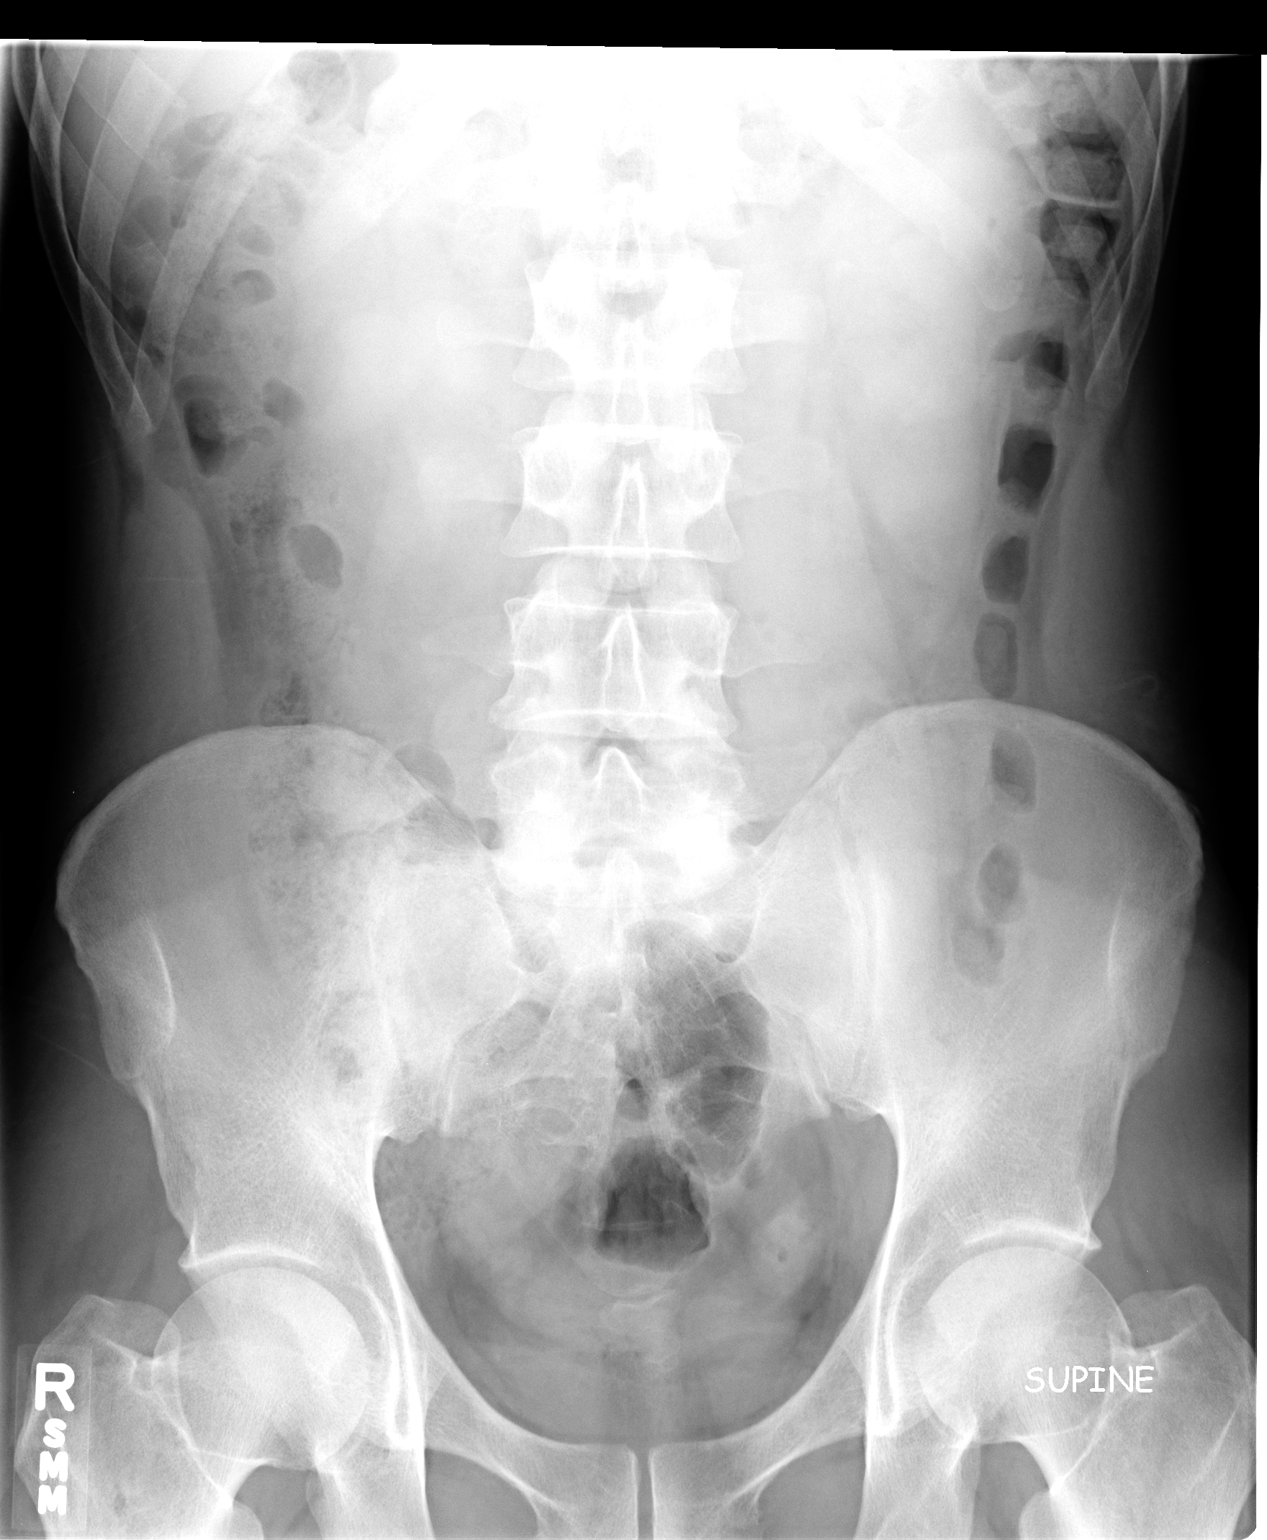

[1 of 1 positions shown; findings below may reference images not displayed]

FINDINGS: Negative for renal calculi.  No bony abnormality.  Normal
bowel gas pattern.
IMPRESSION: Negative

## 2014-08-12 ENCOUNTER — Emergency Department (HOSPITAL_COMMUNITY)
Admission: EM | Admit: 2014-08-12 | Discharge: 2014-08-12 | Disposition: A | Discharge status: Home / Self Care | Payer: Self-pay | Attending: Emergency Medicine | Admitting: Emergency Medicine

## 2014-08-12 DIAGNOSIS — R531 Weakness: Secondary | ICD-10-CM | POA: Insufficient documentation

## 2014-08-12 DIAGNOSIS — Z720 Tobacco use: Secondary | ICD-10-CM | POA: Insufficient documentation

## 2014-08-12 DIAGNOSIS — R42 Dizziness and giddiness: Secondary | ICD-10-CM | POA: Insufficient documentation

## 2014-08-12 DIAGNOSIS — G588 Other specified mononeuropathies: Secondary | ICD-10-CM | POA: Insufficient documentation

## 2014-08-12 DIAGNOSIS — R55 Syncope and collapse: Secondary | ICD-10-CM | POA: Insufficient documentation

## 2014-08-12 DIAGNOSIS — R200 Anesthesia of skin: Secondary | ICD-10-CM | POA: Insufficient documentation

## 2014-08-12 LAB — CBC WITH DIFFERENTIAL/PLATELET
Basophils Absolute: 0 10*3/uL (ref 0.0–0.1)
Basophils Relative: 0 % (ref 0–1)
EOS ABS: 0.3 10*3/uL (ref 0.0–0.7)
EOS PCT: 6 % — AB (ref 0–5)
HEMATOCRIT: 38.8 % — AB (ref 39.0–52.0)
HEMOGLOBIN: 13.6 g/dL (ref 13.0–17.0)
LYMPHS ABS: 1.5 10*3/uL (ref 0.7–4.0)
Lymphocytes Relative: 27 % (ref 12–46)
MCH: 30.6 pg (ref 26.0–34.0)
MCHC: 35.1 g/dL (ref 30.0–36.0)
MCV: 87.2 fL (ref 78.0–100.0)
MONO ABS: 0.3 10*3/uL (ref 0.1–1.0)
MONOS PCT: 5 % (ref 3–12)
Neutro Abs: 3.3 10*3/uL (ref 1.7–7.7)
Neutrophils Relative %: 62 % (ref 43–77)
Platelets: 215 10*3/uL (ref 150–400)
RBC: 4.45 MIL/uL (ref 4.22–5.81)
RDW: 13.4 % (ref 11.5–15.5)
WBC: 5.4 10*3/uL (ref 4.0–10.5)

## 2014-08-12 LAB — URINALYSIS, ROUTINE W REFLEX MICROSCOPIC
Bilirubin Urine: NEGATIVE
GLUCOSE, UA: NEGATIVE mg/dL
Hgb urine dipstick: NEGATIVE
KETONES UR: NEGATIVE mg/dL
LEUKOCYTES UA: NEGATIVE
Nitrite: NEGATIVE
PH: 6 (ref 5.0–8.0)
Protein, ur: NEGATIVE mg/dL
Specific Gravity, Urine: 1.019 (ref 1.005–1.030)
Urobilinogen, UA: 1 mg/dL (ref 0.0–1.0)

## 2014-08-12 LAB — I-STAT TROPONIN, ED: TROPONIN I, POC: 0 ng/mL (ref 0.00–0.08)

## 2014-08-12 LAB — BASIC METABOLIC PANEL
ANION GAP: 6 (ref 5–15)
BUN: 14 mg/dL (ref 6–20)
CALCIUM: 8.4 mg/dL — AB (ref 8.9–10.3)
CO2: 24 mmol/L (ref 22–32)
Chloride: 107 mmol/L (ref 101–111)
Creatinine, Ser: 1.22 mg/dL (ref 0.61–1.24)
GFR calc Af Amer: >60 mL/min (ref >60)
GLUCOSE: 162 mg/dL — AB (ref 65–99)
POTASSIUM: 4.1 mmol/L (ref 3.5–5.1)
SODIUM: 137 mmol/L (ref 135–145)

## 2014-08-12 MED ORDER — SODIUM CHLORIDE 0.9 % IV BOLUS (SEPSIS)
1000.0000 mL | Freq: Once | INTRAVENOUS | Status: AC
  Administered 2014-08-12: 1000 mL via INTRAVENOUS

## 2014-08-12 NOTE — ED Provider Notes (Signed)
CSN: [id number]     Arrival date & time 08/12/14  2003 History   First MD Initiated Contact with Patient 08/12/14 2018     Chief Complaint  Patient presents with  . Code Stroke  . Dizziness  . Near Syncope   HPI Pt is a 44 yo male with no known past medical hx presenting after near syncopal episode while at work today.  Pt reports has been donating plasma 2x a week for six months.  Over last three weeks has had multiple episodes where when he hangs his head forward feels that rooms is slightly spinning.  Pt was at work today sitting when reports he room began spinning and vision narrowed and felt may pass out.  Denies any CP, SOB, palpitations or n/v at that time.  When EMS arrived he endorsed left sided numbness of his hands and possible stroke thought to be etiology.  On arrival code stroke initiated due to traige feeling unilateral weakness.  On further questioning pt endorsed bilateral hand numbness for past sevaral months.  Code stoke called off.  EMS thought possible STEMI in field but on review of EKG by ED physician no STEMI and code stemi called off.   No past medical history on file. No past surgical history on file. No family history on file. History  Substance Use Topics  . Smoking status: Current Every Day Smoker  . Smokeless tobacco: Not on file  . Alcohol Use: Yes    Review of Systems  Constitutional: Negative for fever and chills.  HENT: Negative for congestion and sore throat.   Eyes: Negative for pain.  Respiratory: Negative for cough and shortness of breath.   Cardiovascular: Negative for chest pain, palpitations and leg swelling.  Gastrointestinal: Negative for nausea, vomiting, abdominal pain and diarrhea.  Endocrine: Negative.   Genitourinary: Negative for flank pain.  Musculoskeletal: Negative for back pain and neck pain.  Skin: Negative for rash.  Allergic/Immunologic: Negative.   Neurological: Positive for dizziness, weakness, light-headedness and numbness.  Negative for tremors, seizures, syncope, facial asymmetry, speech difficulty and headaches.  Psychiatric/Behavioral: Negative for confusion.   Allergies  Review of patient's allergies indicates no known allergies.  Home Medications   Prior to Admission medications   Medication Sig Start Date End Date Taking? Authorizing Provider  chlorpheniramine-HYDROcodone (TUSSIONEX PENNKINETIC ER) 10-8 MG/5ML LQCR Take 5 mLs by mouth every 12 (twelve) hours. Take 5 mLs by mouth every 12 (twelve) hours. Patient not taking: Reported on 08/12/2014 02/26/12   Ivonne AndrewPeter Dammen, PA-C  ondansetron (ZOFRAN ODT) 8 MG disintegrating tablet 8mg  ODT q4 hours prn nausea Patient not taking: Reported on 08/12/2014 02/26/12   Ivonne AndrewPeter Dammen, PA-C  oseltamivir (TAMIFLU) 75 MG capsule Take 1 capsule (75 mg total) by mouth every 12 (twelve) hours. Patient not taking: Reported on 08/12/2014 02/26/12   Ivonne AndrewPeter Dammen, PA-C   BP 129/78 mmHg  Pulse 88  Resp 18  SpO2 100% Physical Exam  Constitutional: He is oriented to person, place, and time. He appears well-developed and well-nourished.  HENT:  Head: Normocephalic and atraumatic.  Eyes: Conjunctivae and EOM are normal. Pupils are equal, round, and reactive to light.  Neck: Normal range of motion. Neck supple.  Cardiovascular: Normal rate, regular rhythm, normal heart sounds and intact distal pulses.   Pulses:      Radial pulses are 2+ on the right side, and 2+ on the left side.  Pulmonary/Chest: Effort normal and breath sounds normal. No respiratory distress.  Abdominal: Soft. Bowel sounds  are normal. There is no tenderness.  Musculoskeletal: Normal range of motion.       Right hand: Decreased sensation noted. Decreased sensation is present in the medial distribution. Decreased sensation is not present in the ulnar distribution and is not present in the radial distribution. Normal strength noted. He exhibits no finger abduction, no thumb/finger opposition and no wrist extension  trouble.       Left hand: Decreased sensation noted. Decreased sensation is present in the medial distribution. Decreased sensation is not present in the ulnar distribution and is not present in the radial distribution. Normal strength noted. He exhibits no finger abduction, no thumb/finger opposition and no wrist extension trouble.  Neurological: He is alert and oriented to person, place, and time. He has normal strength and normal reflexes. He displays normal reflexes. A sensory deficit is present. No cranial nerve deficit. He displays a negative Romberg sign. GCS eye subscore is 4. GCS verbal subscore is 5. GCS motor subscore is 6.  Normal finger to nose bilaterally.  Rapid alternating movements intact bilaterally.  Normal heal to shin bilaterally.   No pronator drift bilaterally.   Subjective decreased sensation on bilateral thenar eminences.   Skin: Skin is warm and dry.    ED Course  Procedures (including critical care time) Labs Review Labs Reviewed  BASIC METABOLIC PANEL - Abnormal; Notable for the following:    Glucose, Bld 162 (*)    Calcium 8.4 (*)    All other components within normal limits  CBC WITH DIFFERENTIAL/PLATELET - Abnormal; Notable for the following:    HCT 38.8 (*)    Eosinophils Relative 6 (*)    All other components within normal limits  URINALYSIS, ROUTINE W REFLEX MICROSCOPIC (NOT AT [facility])  I-STAT TROPOININ, ED    Imaging Review No results found.   EKG Interpretation None       Date: 08/12/2014  Rate: 78  Rhythm: normal sinus rhythm  QRS Axis: normal  Intervals: normal  ST/T Wave abnormalities: normal  Conduction Disutrbances: none  Narrative Interpretation:   Old EKG Reviewed: No significant changes noted  MDM   Final diagnoses:  Dizziness   Pt is a previously healthy male presenting after near syncopal episode that occurred while at work today.  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   He endorsed left hand numbness to EMS and code stroke initiated in the ED. Rapid evaluation found bilateral numbness for sevaral months and code stroke called off.    On evaluation pt HDS in NAD.  Neurologic exam with only bilateral thenar decreased sensation to light touch per pt.  No new symptoms.  Otherwise no focal abnormalities on neuro exam with normal hand strength.  Negative Phalens and tenels signs but endorses increasing pain at night and possible early carpal tunnel syndrome.  Doubt stroke or TIA.   Pt endorsed no CP, SOB or palpitations prior to near syncopal episode.  EKG with NSR and no acute ischemic changes or arrhythmogenic potential.  Trop negative.  No family hx of unexplained deaths or spontaneous cardiac arrest.  Doubt orthostatic or arrhythomgic syncope. No murmurs on exam. Possible BPH.  No seizure like activity observed andno acute electrolyte derrangments.  Elevated Cr but unknown baseline.  No anemia or leukocytosis. Do not feel CT warranted at this time.  Given bolus in the ED with symptomatic relief.  Given contact to establish PCP and advised to f/u in 2-3 days for further evaluation.  If performed, labs, EKGs, and imaging were reviewed/interpreted by myself and my attending and incorporated into medical decision making.  Discussed pertinent finding with patient or caregiver prior to discharge with no further questions.  Immediate return precautions given and pt or caregiver reports understanding.  Pt care supervised by my attending Dr. Rozelle Logan, MD PGY-2  Emergency Medicine     Tery Sanfilippo, MD 08/13/14 1642  Derwood Kaplan, MD 08/13/14 2239

## 2014-08-12 NOTE — ED Notes (Signed)
Code Stroke cancelled.  

## 2014-08-12 NOTE — Discharge Instructions (Signed)

## 2014-08-12 NOTE — ED Notes (Signed)
Per EMS, pt called out for heaviness and numbness in his left arm. EMS' EKG showed ST elevation, they sent a copy to the ER, and was to hold of calling a STEMI. EMS reports that the pt has been donating plasma twice a week x 6 months. At work today, pt had a near syncopal episode. Pt complaining of SOB and dizziness.

## 2019-09-01 ENCOUNTER — Other Ambulatory Visit: Payer: Self-pay

## 2020-05-09 ENCOUNTER — Emergency Department (HOSPITAL_COMMUNITY)
Admission: EM | Admit: 2020-05-09 | Discharge: 2020-05-09 | Disposition: A | Discharge status: Home / Self Care | Payer: 59 | Attending: Emergency Medicine | Admitting: Emergency Medicine

## 2020-05-09 ENCOUNTER — Emergency Department (HOSPITAL_COMMUNITY): Payer: 59

## 2020-05-09 ENCOUNTER — Encounter (HOSPITAL_COMMUNITY): Payer: Self-pay | Admitting: Emergency Medicine

## 2020-05-09 ENCOUNTER — Other Ambulatory Visit: Payer: Self-pay

## 2020-05-09 DIAGNOSIS — H53142 Visual discomfort, left eye: Secondary | ICD-10-CM | POA: Diagnosis not present

## 2020-05-09 DIAGNOSIS — R519 Headache, unspecified: Secondary | ICD-10-CM | POA: Insufficient documentation

## 2020-05-09 DIAGNOSIS — F17200 Nicotine dependence, unspecified, uncomplicated: Secondary | ICD-10-CM | POA: Insufficient documentation

## 2020-05-09 MED ORDER — METOCLOPRAMIDE HCL 10 MG PO TABS
10.0000 mg | ORAL_TABLET | Freq: Once | ORAL | Status: AC
  Administered 2020-05-09: 10 mg via ORAL
  Filled 2020-05-09: qty 1

## 2020-05-09 MED ORDER — PREDNISONE 20 MG PO TABS
60.0000 mg | ORAL_TABLET | Freq: Once | ORAL | Status: AC
  Administered 2020-05-09: 60 mg via ORAL
  Filled 2020-05-09: qty 3

## 2020-05-09 MED ORDER — PREDNISONE 10 MG PO TABS: 60.0000 mg | ORAL_TABLET | Freq: Every day | ORAL | 0 refills | Status: AC

## 2020-05-09 MED ORDER — METOCLOPRAMIDE HCL 10 MG PO TABS: 10.0000 mg | ORAL_TABLET | Freq: Two times a day (BID) | ORAL | 0 refills | Status: AC | PRN

## 2020-05-09 NOTE — ED Triage Notes (Signed)
Patient complains of intermittent L sided headaches x3 weeks, gets about 1-2 per day, never fully stop, but they wax and wane. States these bring tears to his eyes and make him dizzy when they are bad. Denies N/V/D, fevers, numbness, LOC or blurred vision. Endorses seasonal allergies, usually left sided congestion.

## 2020-05-09 NOTE — Discharge Instructions (Addendum)
It is not clear what is causing your headaches at this time.  Your CT scan of the brain did not show signs of the brain bleed or mass or tumor.  I advised that you follow-up with a neurologist in the clinic.  I placed the referral, and they should contact you within 1-2 business days.  You can take the steroids as prescribed, once every morning, for the next 4 days as prescribed.  This may help with the inflammation and pain in your headache.   You should stick to taking Naproxyn 500 mg tablets ONLY when you have headache pain.  Don't mix this with goodys powder, advil, motrin, or ibuprofen - all of these are NSAIDS medications, which can cause stomach and kidney problems if you take too much.  *  You are having a headache. This condition is very commonly seen in the Emergency Department. No specific cause was found today for your headache. It may have been a migraine or another cause of headache. Stress, anxiety, fatigue, and depression are common triggers for headaches.   Fortunately, your headache today does not appear to be life-threatening or require hospitalization at this time. This may change in the future. Sometimes headaches can appear benign (not harmful), but then more serious symptoms can develop, which should prompt an immediate re-evaluation by your doctor or the emergency department.  It is very important that you speak to your primary care doctor in the next 48 hours, to re-evaluate your symptoms, and to determine whether you need any further diagnostic testing or treatment.   SEEK MEDICAL ATTENTION IF: - you develop reaction or side effects to the medications prescribed.  - your headache is not improving within 48 hours - you have multiple episodes of vomiting or can't drink fluids - you have a change in pattern from your usual headache (eg. New location, new intensity, new symptoms, etc)  RETURN IMMEDIATELY IF you develop a sudden, severe worsening of your headache or confusion,  become poorly responsive, feel like passing out, develop a fever above 100.7F, have a change in speech, vision, or swallowing, develop new weakness, numbness, tingling, or incoordination, or have a seizure.

## 2020-05-09 NOTE — ED Provider Notes (Signed)
[facility]-EMERGENCY DEPT Provider Note   CSN: [id number] Arrival date & time: 05/09/20  9480     History CC:  Headache  Eduardo Velazquez is a 50 y.o. male present emerge department the headache for 3 weeks.  The patient reports that he has been having intermittent left-sided temporal headaches for 3 weeks.  These episodes occur about 1-2 times per day, can last between an hour or 3 hours, and generally will resolve on their own.  They are gradual onset.  He has never had this issue before in the past, does not suffer from migraines or chronic headaches.  He says when he gets these episodes, will have a little photophobia in his left eye, some tearing, and the pain can be very severe.  He otherwise denies blurred vision, loss of vision, numbness or weakness.  He works as a IT trainer.  He denies any trauma, car accidents, massage, or neck injury preceding the onset of these headaches.  He says he has been taking Goody powder, Advil, as well as naproxen 500 mg BID prescribed at an urgent care several days ago, but they don't seem to help much when the headaches come on.  He otherwise does not take chronic NSAIDs.  This headache is unrelated to repositioning or movement.  Nothing makes it better or worse.  Currently he has very mild left-sided headache, minimal symptoms.  HPI     History reviewed. No pertinent past medical history.  Patient Active Problem List   Diagnosis Date Noted  . TB OF LUNG WITH CAVITATION-CULT DX 06/01/2008  . Nonspecific (abnormal) findings on radiological and other examination of body structure 02/21/2008  . CHEST XRAY, ABNORMAL 02/21/2008    History reviewed. No pertinent surgical history.     No family history on file.  Social History   Tobacco Use  . Smoking status: Current Every Day Smoker  . Smokeless tobacco: Never Used  Substance Use Topics  . Alcohol use: Yes  . Drug use: No    Home Medications Prior to Admission  medications   Medication Sig Start Date End Date Taking? Authorizing Provider  metoCLOPramide (REGLAN) 10 MG tablet Take 1 tablet (10 mg total) by mouth 2 (two) times daily as needed for up to 10 doses (Headache). Take daily doses at least 8 hours apart 05/09/20  Yes Keita Demarco, Kermit Balo, MD  predniSONE (DELTASONE) 10 MG tablet Take 6 tablets (60 mg total) by mouth daily for 4 days. Take in the morning 05/10/20 05/14/20 Yes Tytianna Greenley, Kermit Balo, MD  chlorpheniramine-HYDROcodone [facility] PENNKINETIC ER) 10-8 MG/5ML [facility] Take 5 mLs by mouth every 12 (twelve) hours. Take 5 mLs by mouth every 12 (twelve) hours. Patient not taking: Reported on 08/12/2014 02/26/12   Ivonne Andrew, PA-C  ondansetron [facility] ODT) 8 MG disintegrating tablet 8mg  ODT q4 hours prn nausea Patient not taking: Reported on 08/12/2014 02/26/12   02/28/12, PA-C  oseltamivir (TAMIFLU) 75 MG capsule Take 1 capsule (75 mg total) by mouth every 12 (twelve) hours. Patient not taking: Reported on 08/12/2014 02/26/12   02/28/12, PA-C    Allergies    Patient has no known allergies.  Review of Systems   Review of Systems  Constitutional: Negative for chills and fever.  HENT: Negative for ear pain and sore throat.   Eyes: Negative for photophobia and visual disturbance.  Respiratory: Negative for cough and shortness of breath.   Cardiovascular: Negative for chest pain and palpitations.  Gastrointestinal: Negative for abdominal pain and vomiting.  Genitourinary: Negative for dysuria and hematuria.  Musculoskeletal: Negative for arthralgias and back pain.  Skin: Negative for color change and rash.  Neurological: Positive for light-headedness and headaches. Negative for seizures, syncope, facial asymmetry, speech difficulty and weakness.  All other systems reviewed and are negative.   Physical Exam Updated Vital Signs BP (!) 120/91 (BP Location: Left Arm)   Pulse 87   Temp 98 F (36.7 C) (Oral)   Resp 18   Ht 6\' 2"  (1.88 m)   Wt 92.1  kg   SpO2 100%   BMI 26.06 kg/m   Physical Exam Constitutional:      General: He is not in acute distress. HENT:     Head: Normocephalic and atraumatic.     Comments: No temporal artery tenderness on exam Headache is not reproducible with head movement or palpation of posterior neck No cervical or paracervical tenderness Eyes:     Extraocular Movements: Extraocular movements intact.     Conjunctiva/sclera: Conjunctivae normal.     Pupils: Pupils are equal, round, and reactive to light.  Cardiovascular:     Rate and Rhythm: Normal rate and regular rhythm.  Pulmonary:     Effort: Pulmonary effort is normal. No respiratory distress.  Abdominal:     General: There is no distension.     Tenderness: There is no abdominal tenderness.  Skin:    General: Skin is warm and dry.  Neurological:     General: No focal deficit present.     Mental Status: He is alert. Mental status is at baseline.  Psychiatric:        Mood and Affect: Mood normal.        Behavior: Behavior normal.     ED Results / Procedures / Treatments   Labs (all labs ordered are listed, but only abnormal results are displayed) Labs Reviewed - No data to display  EKG None  Radiology CT Head Wo Contrast  Result Date: 05/09/2020 CLINICAL DATA:  Headache, intracranial hemorrhage suspected. Persistent left temporal headache 3 weeks. EXAM: CT HEAD WITHOUT CONTRAST TECHNIQUE: Contiguous axial images were obtained from the base of the skull through the vertex without intravenous contrast. COMPARISON:  01/02/2003 FINDINGS: Brain: No evidence of acute infarction, hemorrhage, hydrocephalus, extra-axial collection or mass lesion/mass effect. Vascular: No hyperdense vessel or unexpected calcification. Skull: Normal. Negative for fracture or focal lesion. Sinuses/Orbits: No acute finding. Other: None. IMPRESSION: Normal head CT. Electronically Signed   By: 01/04/2003 M.D.   On: 05/09/2020 10:55    Procedures Procedures    Medications Ordered in ED Medications  predniSONE (DELTASONE) tablet 60 mg (60 mg Oral Given 05/09/20 1051)  metoCLOPramide (REGLAN) tablet 10 mg (10 mg Oral Given 05/09/20 1052)    ED Course  I have reviewed the triage vital signs and the nursing notes.  Pertinent labs & imaging results that were available during my care of the patient were reviewed by me and considered in my medical decision making (see chart for details).  This patient presents to the Emergency Department with complaint of headache.  This involves an extensive number of treatment options, and is a complaint that carries with it a high risk of complications and morbidity.  The differential diagnosis for headache includes tension type headache vs cluster headaches vs occipital headache vs migraine vs sinusitis vs intracranial bleed vs other  With the localization of his pain in the left temporal region and left eye, I wonder about cluster headaches.    This seems less  likely trigeminal neuralgia as there is no involvement of the mid or lower face, no onset with palpation or stimulation of the region.  Difficult to exclude this possibility entirely however.  I do think the patient would benefit from a referral to the neurology clinic for evaluation of these headaches.  He may need to be on abortive or different medications.  In the meantime, we can start him on prednisone today, as well as give some p.o. Reglan for his symptoms at this time.  We will get a CT scan of the brain to rule out intracranial mass or bleed as he has not had neuroimaging in many years, and these headaches are unusual pattern.  He otherwise has no signs or symptoms of infection including meningitis, viral illness, or Covid.  I do not believe we need blood work at this time.  I have a low suspicion for vertebral artery or carotid injury or dissection per his history or clinical exam.  After the interventions stated above, I reevaluated the patient and  found that the patient remained clinically stable.  Based on the patient's clinical exam, vital signs, risk factors, and ED testing, I felt that the patient's overall risk of life-threatening emergency such as ICH, meningitis, intracranial mass or tumor was quite low.  I suspect this clinical presentation is most consistent with headache, but explained to the patient that this evaluation was not a definitive diagnostic workup.  I discussed outpatient follow up with primary care provider, and provided specialist office number on the patient's discharge paper if a referral was deemed necessary.  I discussed return precautions with the patient. I felt the patient was clinically stable for discharge.      Final Clinical Impression(s) / ED Diagnoses Final diagnoses:  Nonintractable headache, unspecified chronicity pattern, unspecified headache type    Rx / DC Orders ED Discharge Orders         Ordered    predniSONE (DELTASONE) 10 MG tablet  Daily        05/09/20 1109    Ambulatory referral to Neurology       Comments: An appointment is requested in approximately: 1 week New onset headaches x 3 weeks, possible cluster headaches   05/09/20 1107    metoCLOPramide (REGLAN) 10 MG tablet  2 times daily PRN        05/09/20 1109           Terald Sleeper, MD 05/09/20 1659

## 2020-05-10 ENCOUNTER — Encounter: Payer: Self-pay | Admitting: Neurology

## 2020-07-13 NOTE — Progress Notes (Deleted)
NEUROLOGY CONSULTATION NOTE  Eduardo Velazquez MRN: [id number] DOB: February 05, 1971  Referring provider: Alvester Chou, MD (ED referral) Primary care provider: ***  Reason for consult:  headache  Assessment/Plan:   ***   Subjective:  Dacota Ruben is a 50 year old ***-handed male who presents for headache.  History supplemented by ED note.  Onset:  [place] 2022 Location:  Left temporal Quality:  *** Intensity:  Severe.  He denies  thunderclap headache or severe headache that wakes him from sleep. Aura:  none Prodrome:  none Associated symptoms:  Photophobia and lacrimation in left eye.  He denies associated unilateral numbness or weakness. Duration:  1-3 hours Frequency:  1 to 2 times daily Frequency of abortive medication: *** Triggers:  *** Relieving factors:  *** Activity:  ***  Current NSAIDS/analgesics:  Naproxen, ibuprofen, Goody powder Current triptans:  *** Current ergotamine:  *** Current anti-emetic:  Reglan 10mg  Current muscle relaxants:  *** Current Antihypertensive medications:  *** Current Antidepressant medications:  *** Current Anticonvulsant medications:  *** Current anti-CGRP:  *** Current Vitamins/Herbal/Supplements:  *** Current Antihistamines/Decongestants:  *** Other therapy:  *** Hormone/birth control:  *** Other medications:  ***  He was seen in the ED on 05/09/2020.  CT head personally reviewed was negative.  He was discharged on a prednisone taper ***.  ***.  While in 07/09/2020, he was seen in a local ED on 05/14/2020 where he was treated with a headache cocktail.  Past NSAIDS/analgesics/steroid:  prednisone Past abortive triptans:  *** Past abortive ergotamine:  *** Past muscle relaxants:  *** Past anti-emetic:  Zofran Past antihypertensive medications:  *** Past antidepressant medications:  *** Past anticonvulsant medications:  *** Past anti-CGRP:  *** Past vitamins/Herbal/Supplements:  *** Past antihistamines/decongestants:  *** Other past  therapies:  ***  Caffeine:  *** Alcohol:  *** Smoker:  *** Diet:  *** Exercise:  *** Depression:  ***; Anxiety:  *** Other pain:  *** Sleep hygiene:  *** Family history of headache:  ***      PAST MEDICAL HISTORY: No past medical history on file.  PAST SURGICAL HISTORY: No past surgical history on file.  MEDICATIONS: Current Outpatient Medications on File Prior to Visit  Medication Sig Dispense Refill  . chlorpheniramine-HYDROcodone (TUSSIONEX PENNKINETIC ER) 10-8 MG/5ML LQCR Take 5 mLs by mouth every 12 (twelve) hours. Take 5 mLs by mouth every 12 (twelve) hours. (Patient not taking: Reported on 08/12/2014) 140 mL 0  . metoCLOPramide (REGLAN) 10 MG tablet Take 1 tablet (10 mg total) by mouth 2 (two) times daily as needed for up to 10 doses (Headache). Take daily doses at least 8 hours apart 10 tablet 0  . ondansetron (ZOFRAN ODT) 8 MG disintegrating tablet 8mg  ODT q4 hours prn nausea (Patient not taking: Reported on 08/12/2014) 20 tablet 0  . oseltamivir (TAMIFLU) 75 MG capsule Take 1 capsule (75 mg total) by mouth every 12 (twelve) hours. (Patient not taking: Reported on 08/12/2014) 10 capsule 0   No current facility-administered medications on file prior to visit.    ALLERGIES: No Known Allergies  FAMILY HISTORY: No family history on file.  Objective:  *** General: No acute distress.  Patient appears well-groomed.   Head:  Normocephalic/atraumatic Eyes:  fundi examined but not visualized Neck: supple, no paraspinal tenderness, full range of motion Back: No paraspinal tenderness Heart: regular rate and rhythm Lungs: Clear to auscultation bilaterally. Vascular: No carotid bruits. Neurological Exam: Mental status: alert and oriented to person, place, and time, recent and remote memory intact,  fund of knowledge intact, attention and concentration intact, speech fluent and not dysarthric, language intact. Cranial nerves: CN I: not tested CN II: pupils equal, round and  reactive to light, visual fields intact CN III, IV, VI:  full range of motion, no nystagmus, no ptosis CN V: facial sensation intact. CN VII: upper and lower face symmetric CN VIII: hearing intact CN IX, X: gag intact, uvula midline CN XI: sternocleidomastoid and trapezius muscles intact CN XII: tongue midline Bulk & Tone: normal, no fasciculations. Motor:  muscle strength 5/5 throughout Sensation:  Pinprick, temperature and vibratory sensation intact. Deep Tendon Reflexes:  2+ throughout,  toes downgoing.   Finger to nose testing:  Without dysmetria.   Heel to shin:  Without dysmetria.   Gait:  Normal station and stride.  Romberg negative.    Thank you for allowing me to take part in the care of this patient.  Shon Millet, DO  CC: ***

## 2020-07-15 ENCOUNTER — Ambulatory Visit: Payer: 59 | Admitting: Neurology
